# Patient Record
Sex: Female | Born: 1995 | Race: Black or African American | Hispanic: No | Marital: Single | Smoking: Never smoker
Health system: Southern US, Community
[De-identification: ages and names within clinical notes are randomized; demographics above are authoritative.]

## PROBLEM LIST (undated history)

## (undated) ENCOUNTER — Ambulatory Visit: Admission: EM | Discharge status: Absent without leave | Payer: Managed Care, Other (non HMO)

## (undated) DIAGNOSIS — S59909A Unspecified injury of unspecified elbow, initial encounter: Secondary | ICD-10-CM

## (undated) HISTORY — PX: WISDOM TOOTH EXTRACTION: SHX21

---
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
## 2014-04-28 ENCOUNTER — Inpatient Hospital Stay (HOSPITAL_COMMUNITY)
Admission: AD | Admit: 2014-04-28 | Discharge: 2014-04-28 | Disposition: A | Discharge status: Home / Self Care | Payer: Medicaid Other | Source: Ambulatory Visit | Attending: Obstetrics & Gynecology | Admitting: Obstetrics & Gynecology

## 2014-04-28 ENCOUNTER — Encounter (HOSPITAL_COMMUNITY): Payer: Self-pay | Admitting: *Deleted

## 2014-04-28 DIAGNOSIS — N938 Other specified abnormal uterine and vaginal bleeding: Secondary | ICD-10-CM | POA: Insufficient documentation

## 2014-04-28 DIAGNOSIS — N920 Excessive and frequent menstruation with regular cycle: Secondary | ICD-10-CM

## 2014-04-28 DIAGNOSIS — N925 Other specified irregular menstruation: Secondary | ICD-10-CM | POA: Insufficient documentation

## 2014-04-28 DIAGNOSIS — N949 Unspecified condition associated with female genital organs and menstrual cycle: Secondary | ICD-10-CM | POA: Insufficient documentation

## 2014-04-28 LAB — URINALYSIS, ROUTINE W REFLEX MICROSCOPIC
Bilirubin Urine: NEGATIVE
Glucose, UA: NEGATIVE mg/dL
Hgb urine dipstick: NEGATIVE
Ketones, ur: NEGATIVE mg/dL
Leukocytes, UA: NEGATIVE
Nitrite: NEGATIVE
Protein, ur: NEGATIVE mg/dL
Specific Gravity, Urine: 1.025 (ref 1.005–1.030)
Urobilinogen, UA: 0.2 mg/dL (ref 0.0–1.0)
pH: 6 (ref 5.0–8.0)

## 2014-04-28 LAB — CBC
HCT: 37.4 % (ref 36.0–46.0)
Hemoglobin: 12.1 g/dL (ref 12.0–15.0)
MCH: 22.1 pg — ABNORMAL LOW (ref 26.0–34.0)
MCHC: 32.4 g/dL (ref 30.0–36.0)
MCV: 68.2 fL — ABNORMAL LOW (ref 78.0–100.0)
Platelets: 363 10*3/uL (ref 150–400)
RBC: 5.48 MIL/uL — ABNORMAL HIGH (ref 3.87–5.11)
RDW: 16.2 % — ABNORMAL HIGH (ref 11.5–15.5)
WBC: 8.4 10*3/uL (ref 4.0–10.5)

## 2014-04-28 LAB — WET PREP, GENITAL
Trich, Wet Prep: NONE SEEN
Yeast Wet Prep HPF POC: NONE SEEN

## 2014-04-28 LAB — POCT PREGNANCY, URINE: Preg Test, Ur: NEGATIVE

## 2014-04-28 NOTE — MAU Note (Signed)
Pt mother brough her in. Has been on her cycle for 3 weeks and starting to feel weak and light headed

## 2014-04-28 NOTE — Discharge Instructions (Signed)
Contraception Choices Contraception (birth control) is the use of any methods or devices to prevent pregnancy. Below are some methods to help avoid pregnancy. HORMONAL METHODS   Contraceptive implant This is a thin, plastic tube containing progesterone hormone. It does not contain estrogen hormone. Your health care provider inserts the tube in the inner part of the upper arm. The tube can remain in place for up to 3 years. After 3 years, the implant must be removed. The implant prevents the ovaries from releasing an egg (ovulation), thickens the cervical mucus to prevent sperm from entering the uterus, and thins the lining of the inside of the uterus.  Progesterone-only injections These injections are given every 3 months by your health care provider to prevent pregnancy. This synthetic progesterone hormone stops the ovaries from releasing eggs. It also thickens cervical mucus and changes the uterine lining. This makes it harder for sperm to survive in the uterus.  Birth control pills These pills contain estrogen and progesterone hormone. They work by preventing the ovaries from releasing eggs (ovulation). They also cause the cervical mucus to thicken, preventing the sperm from entering the uterus. Birth control pills are prescribed by a health care provider.Birth control pills can also be used to treat heavy periods.  Minipill This type of birth control pill contains only the progesterone hormone. They are taken every day of each month and must be prescribed by your health care provider.  Birth control patch The patch contains hormones similar to those in birth control pills. It must be changed once a week and is prescribed by a health care provider.  Vaginal ring The ring contains hormones similar to those in birth control pills. It is left in the vagina for 3 weeks, removed for 1 week, and then a new one is put back in place. The patient must be comfortable inserting and removing the ring from the  vagina.A health care provider's prescription is necessary.  Emergency contraception Emergency contraceptives prevent pregnancy after unprotected sexual intercourse. This pill can be taken right after sex or up to 5 days after unprotected sex. It is most effective the sooner you take the pills after having sexual intercourse. Most emergency contraceptive pills are available without a prescription. Check with your pharmacist. Do not use emergency contraception as your only form of birth control. BARRIER METHODS   Female condom This is a thin sheath (latex or rubber) that is worn over the penis during sexual intercourse. It can be used with spermicide to increase effectiveness.  Female condom. This is a soft, loose-fitting sheath that is put into the vagina before sexual intercourse.  Diaphragm This is a soft, latex, dome-shaped barrier that must be fitted by a health care provider. It is inserted into the vagina, along with a spermicidal jelly. It is inserted before intercourse. The diaphragm should be left in the vagina for 6 to 8 hours after intercourse.  Cervical cap This is a round, soft, latex or plastic cup that fits over the cervix and must be fitted by a health care provider. The cap can be left in place for up to 48 hours after intercourse.  Sponge This is a soft, circular piece of polyurethane foam. The sponge has spermicide in it. It is inserted into the vagina after wetting it and before sexual intercourse.  Spermicides These are chemicals that kill or block sperm from entering the cervix and uterus. They come in the form of creams, jellies, suppositories, foam, or tablets. They do not require a   prescription. They are inserted into the vagina with an applicator before having sexual intercourse. The process must be repeated every time you have sexual intercourse. INTRAUTERINE CONTRACEPTION  Intrauterine device (IUD) This is a T-shaped device that is put in a woman's uterus during a  menstrual period to prevent pregnancy. There are 2 types:  Copper IUD This type of IUD is wrapped in copper wire and is placed inside the uterus. Copper makes the uterus and fallopian tubes produce a fluid that kills sperm. It can stay in place for 10 years.  Hormone IUD This type of IUD contains the hormone progestin (synthetic progesterone). The hormone thickens the cervical mucus and prevents sperm from entering the uterus, and it also thins the uterine lining to prevent implantation of a fertilized egg. The hormone can weaken or kill the sperm that get into the uterus. It can stay in place for 3 5 years, depending on which type of IUD is used. PERMANENT METHODS OF CONTRACEPTION  Female tubal ligation This is when the woman's fallopian tubes are surgically sealed, tied, or blocked to prevent the egg from traveling to the uterus.  Hysteroscopic sterilization This involves placing a small coil or insert into each fallopian tube. Your doctor uses a technique called hysteroscopy to do the procedure. The device causes scar tissue to form. This results in permanent blockage of the fallopian tubes, so the sperm cannot fertilize the egg. It takes about 3 months after the procedure for the tubes to become blocked. You must use another form of birth control for these 3 months.  Female sterilization This is when the female has the tubes that carry sperm tied off (vasectomy).This blocks sperm from entering the vagina during sexual intercourse. After the procedure, the man can still ejaculate fluid (semen). NATURAL PLANNING METHODS  Natural family planning This is not having sexual intercourse or using a barrier method (condom, diaphragm, cervical cap) on days the woman could become pregnant.  Calendar method This is keeping track of the length of each menstrual cycle and identifying when you are fertile.  Ovulation method This is avoiding sexual intercourse during ovulation.  Symptothermal method This is  avoiding sexual intercourse during ovulation, using a thermometer and ovulation symptoms.  Post ovulation method This is timing sexual intercourse after you have ovulated. Regardless of which type or method of contraception you choose, it is important that you use condoms to protect against the transmission of sexually transmitted infections (STIs). Talk with your health care provider about which form of contraception is most appropriate for you. Document Released: 11/20/2005 Document Revised: 07/23/2013 Document Reviewed: 05/15/2013 Exit[employer] Patient Information 2014 [employer].  

## 2014-04-28 NOTE — MAU Provider Note (Signed)
History     CSN: [id number]  Arrival date and time: 04/28/14 1548   First Provider Initiated Contact with Patient 04/28/14 1948      Chief Complaint  Patient presents with  . Vaginal Bleeding   HPI  Ms. Gina Salas is a 18 y.o. female who presents today to MAU with concerns regarding a heavy menstrual cycle. She started her cycle on May 7th; cycle started light and progressed to a heavy cycle. She noticed approximately 1 week following the start of her cycle that the bleeding changed to brown. A couple of days ago she started bleeding again and it is now heavy.   She is scheduled to see a family practice Dr. On June 16th.   OB History   Grav Para Term Preterm Abortions TAB SAB Ect Mult Living                  History reviewed. No pertinent past medical history.  History reviewed. No pertinent past surgical history.  History reviewed. No pertinent family history.  History  Substance Use Topics  . Smoking status: Never Smoker   . Smokeless tobacco: Never Used  . Alcohol Use: No    Allergies: No Known Allergies  Prescriptions prior to admission  Medication Sig Dispense Refill  . cetirizine (ZYRTEC) 10 MG tablet Take 10 mg by mouth daily as needed for allergies.      Marland Kitchen. ibuprofen (ADVIL,MOTRIN) 200 MG tablet Take 400 mg by mouth every 6 (six) hours as needed for cramping.       Results for orders placed during the hospital encounter of 04/28/14 (from the past 48 hour(s))  URINALYSIS, ROUTINE W REFLEX MICROSCOPIC     Status: None   Collection Time    04/28/14  4:50 PM      Result Value Ref Range   Color, Urine YELLOW  YELLOW   APPearance CLEAR  CLEAR   Specific Gravity, Urine 1.025  1.005 - 1.030   pH 6.0  5.0 - 8.0   Glucose, UA NEGATIVE  NEGATIVE mg/dL   Hgb urine dipstick NEGATIVE  NEGATIVE   Bilirubin Urine NEGATIVE  NEGATIVE   Ketones, ur NEGATIVE  NEGATIVE mg/dL   Protein, ur NEGATIVE  NEGATIVE mg/dL   Urobilinogen, UA 0.2  0.0 - 1.0 mg/dL   Nitrite  NEGATIVE  NEGATIVE   Leukocytes, UA NEGATIVE  NEGATIVE   Comment: MICROSCOPIC NOT DONE ON URINES WITH NEGATIVE PROTEIN, BLOOD, LEUKOCYTES, NITRITE, OR GLUCOSE <1000 mg/dL.  POCT PREGNANCY, URINE     Status: None   Collection Time    04/28/14  5:04 PM      Result Value Ref Range   Preg Test, Ur NEGATIVE  NEGATIVE   Comment:            THE SENSITIVITY OF THIS     METHODOLOGY IS >24 mIU/mL  CBC     Status: Abnormal   Collection Time    04/28/14  6:55 PM      Result Value Ref Range   WBC 8.4  4.0 - 10.5 K/uL   RBC 5.48 (*) 3.87 - 5.11 MIL/uL   Hemoglobin 12.1  12.0 - 15.0 g/dL   HCT 78.237.4  95.636.0 - 21.346.0 %   MCV 68.2 (*) 78.0 - 100.0 fL   MCH 22.1 (*) 26.0 - 34.0 pg   MCHC 32.4  30.0 - 36.0 g/dL   RDW 08.616.2 (*) 57.811.5 - 46.915.5 %   Platelets 363  150 - 400 K/uL  Review of Systems  Constitutional: Negative for fever and chills.  Gastrointestinal: Negative for nausea, vomiting, abdominal pain, diarrhea and constipation.  Genitourinary:       No vaginal discharge. + vaginal bleeding. No dysuria.    Physical Exam   Blood pressure 115/73, pulse 62, temperature 98.5 F (36.9 C), temperature source Oral, resp. rate 16, height 5\' 8"  (1.727 m), weight 60.147 kg (132 lb 9.6 oz), last menstrual period 04/09/2014, SpO2 100.00%.  Physical Exam  Constitutional: She is oriented to person, place, and time. She appears well-developed and well-nourished. No distress.  HENT:  Head: Normocephalic.  Eyes: Pupils are equal, round, and reactive to light.  Neck: Neck supple.  Respiratory: Effort normal.  GI: Soft. She exhibits no distension. There is no tenderness. There is no rebound and no guarding.  Genitourinary:  Speculum exam: Vagina - Small amount of dark red blood in the vault. Mild odor  Cervix - small, mucus bloody discharge  Bimanual exam: Cervix closed, no CMT  Uterus non tender, normal size Adnexa non tender, no masses bilaterally GC/Chlam, wet prep done Chaperone present   Musculoskeletal: Normal range of motion.  Neurological: She is alert and oriented to person, place, and time.  Skin: Skin is warm. She is not diaphoretic.  Psychiatric: Her behavior is normal.   Results for orders placed during the hospital encounter of 04/28/14 (from the past 24 hour(s))  URINALYSIS, ROUTINE W REFLEX MICROSCOPIC     Status: None   Collection Time    04/28/14  4:50 PM      Result Value Ref Range   Color, Urine YELLOW  YELLOW   APPearance CLEAR  CLEAR   Specific Gravity, Urine 1.025  1.005 - 1.030   pH 6.0  5.0 - 8.0   Glucose, UA NEGATIVE  NEGATIVE mg/dL   Hgb urine dipstick NEGATIVE  NEGATIVE   Bilirubin Urine NEGATIVE  NEGATIVE   Ketones, ur NEGATIVE  NEGATIVE mg/dL   Protein, ur NEGATIVE  NEGATIVE mg/dL   Urobilinogen, UA 0.2  0.0 - 1.0 mg/dL   Nitrite NEGATIVE  NEGATIVE   Leukocytes, UA NEGATIVE  NEGATIVE  POCT PREGNANCY, URINE     Status: None   Collection Time    04/28/14  5:04 PM      Result Value Ref Range   Preg Test, Ur NEGATIVE  NEGATIVE  CBC     Status: Abnormal   Collection Time    04/28/14  6:55 PM      Result Value Ref Range   WBC 8.4  4.0 - 10.5 K/uL   RBC 5.48 (*) 3.87 - 5.11 MIL/uL   Hemoglobin 12.1  12.0 - 15.0 g/dL   HCT 52.8  41.3 - 24.4 %   MCV 68.2 (*) 78.0 - 100.0 fL   MCH 22.1 (*) 26.0 - 34.0 pg   MCHC 32.4  30.0 - 36.0 g/dL   RDW 01.0 (*) 27.2 - 53.6 %   Platelets 363  150 - 400 K/uL  WET PREP, GENITAL     Status: Abnormal   Collection Time    04/28/14  8:20 PM      Result Value Ref Range   Yeast Wet Prep HPF POC NONE SEEN  NONE SEEN   Trich, Wet Prep NONE SEEN  NONE SEEN   Clue Cells Wet Prep HPF POC FEW (*) NONE SEEN   WBC, Wet Prep HPF POC FEW (*) NONE SEEN    MAU Course  Procedures None  MDM Pt requested parents  to step out of the room to discuss personal/ sexual questions. Pt is sexually active and would like testing for STI's; this is her first pelvic exam. Pt would like to wait to discuss birth control options  with her new Dr. On June 16  Report given to oncoming CNM who resumes care of the patient  Assessment and Plan   A: Abnormal vaginal bleeding Stable hgb. Patient has an appointment with FM on 05/19/14 Return to MAU as needed   Gina Salas 04/28/2014, 7:48 PM

## 2014-04-28 NOTE — MAU Note (Signed)
Patient states she started her periods at about 13-14. Has been monthly but not always regular. Started her last period on 5-7 and was light, has had bleeding every day and is getting heavier. Has a tampon in since 1400.

## 2014-04-29 ENCOUNTER — Telehealth: Payer: Self-pay

## 2014-04-29 LAB — GC/CHLAMYDIA PROBE AMP
CT Probe RNA: POSITIVE — AB
GC Probe RNA: NEGATIVE

## 2014-04-29 MED ORDER — AZITHROMYCIN 500 MG PO TABS
1000.0000 mg | ORAL_TABLET | Freq: Once | ORAL | Status: DC
Start: 2014-04-29 — End: 2014-05-20

## 2014-04-29 NOTE — Telephone Encounter (Signed)
Mdication e-prescribed. Pt. Called and informed. No questions or concerns.

## 2014-04-29 NOTE — Telephone Encounter (Signed)
Message copied by Louanna Raw on Wed Apr 29, 2014  4:30 PM ------      Message from: Pennie Banter      Created: Wed Apr 29, 2014  2:23 PM       Patient returned call, notified her of positive chlamydia culture.  Patient has not been treated and will need Rx called in per protocol to [facility].  Instructed patient to notify her partner for treatment. ------

## 2014-05-20 ENCOUNTER — Emergency Department (INDEPENDENT_AMBULATORY_CARE_PROVIDER_SITE_OTHER)
Admission: EM | Admit: 2014-05-20 | Discharge: 2014-05-20 | Disposition: A | Discharge status: Home / Self Care | Payer: Medicaid Other | Source: Home / Self Care

## 2014-05-20 ENCOUNTER — Encounter (HOSPITAL_COMMUNITY): Payer: Self-pay | Admitting: Emergency Medicine

## 2014-05-20 DIAGNOSIS — J029 Acute pharyngitis, unspecified: Secondary | ICD-10-CM

## 2014-05-20 LAB — POCT RAPID STREP A: STREPTOCOCCUS, GROUP A SCREEN (DIRECT): NEGATIVE

## 2014-05-20 MED ORDER — AMOXICILLIN 500 MG PO CAPS: 1000.0000 mg | ORAL_CAPSULE | Freq: Two times a day (BID) | ORAL | Status: DC

## 2014-05-20 NOTE — ED Provider Notes (Signed)
Medical screening examination/treatment/procedure(s) were performed by a resident physician or non-physician practitioner and as the supervising physician I was immediately available for consultation/collaboration.  Evan Corey, MD    Evan S Corey, MD 05/20/14 2013 

## 2014-05-20 NOTE — Discharge Instructions (Signed)
Pharyngitis °Pharyngitis is redness, pain, and swelling (inflammation) of your pharynx.  °CAUSES  °Pharyngitis is usually caused by infection. Most of the time, these infections are from viruses (viral) and are part of a cold. However, sometimes pharyngitis is caused by bacteria (bacterial). Pharyngitis can also be caused by allergies. Viral pharyngitis may be spread from person to person by coughing, sneezing, and personal items or utensils (cups, forks, spoons, toothbrushes). Bacterial pharyngitis may be spread from person to person by more intimate contact, such as kissing.  °SIGNS AND SYMPTOMS  °Symptoms of pharyngitis include:   °· Sore throat.   °· Tiredness (fatigue).   °· Low-grade fever.   °· Headache. °· Joint pain and muscle aches. °· Skin rashes. °· Swollen lymph nodes. °· Plaque-like film on throat or tonsils (often seen with bacterial pharyngitis). °DIAGNOSIS  °Your health care provider will ask you questions about your illness and your symptoms. Your medical history, along with a physical exam, is often all that is needed to diagnose pharyngitis. Sometimes, a rapid strep test is done. Other lab tests may also be done, depending on the suspected cause.  °TREATMENT  °Viral pharyngitis will usually get better in 3-4 days without the use of medicine. Bacterial pharyngitis is treated with medicines that kill germs (antibiotics).  °HOME CARE INSTRUCTIONS  °· Drink enough water and fluids to keep your urine clear or pale yellow.   °· Only take over-the-counter or prescription medicines as directed by your health care provider:   °· If you are prescribed antibiotics, make sure you finish them even if you start to feel better.   °· Do not take aspirin.   °· Get lots of rest.   °· Gargle with 8 oz of salt water (½ tsp of salt per 1 qt of water) as often as every 1-2 hours to soothe your throat.   °· Throat lozenges (if you are not at risk for choking) or sprays may be used to soothe your throat. °SEEK MEDICAL  CARE IF:  °· You have large, tender lumps in your neck. °· You have a rash. °· You cough up green, yellow-brown, or bloody spit. °SEEK IMMEDIATE MEDICAL CARE IF:  °· Your neck becomes stiff. °· You drool or are unable to swallow liquids. °· You vomit or are unable to keep medicines or liquids down. °· You have severe pain that does not go away with the use of recommended medicines. °· You have trouble breathing (not caused by a stuffy nose). °MAKE SURE YOU:  °· Understand these instructions. °· Will watch your condition. °· Will get help right away if you are not doing well or get worse. °Document Released: 11/20/2005 Document Revised: 09/10/2013 Document Reviewed: 07/28/2013 °ExitCare® Patient Information ©2015 [employer]. This information is not intended to replace advice given to you by your health care provider. Make sure you discuss any questions you have with your health care provider. ° °Sore Throat °A sore throat is pain, burning, irritation, or scratchiness of the throat. There is often pain or tenderness when swallowing or talking. A sore throat may be accompanied by other symptoms, such as coughing, sneezing, fever, and swollen neck glands. A sore throat is often the first sign of another sickness, such as a cold, flu, strep throat, or mononucleosis (commonly known as mono). Most sore throats go away without medical treatment. °CAUSES  °The most common causes of a sore throat include: °· A viral infection, such as a cold, flu, or mono. °· A bacterial infection, such as strep throat, tonsillitis, or whooping cough. °·   Seasonal allergies. °· Dryness in the air. °· Irritants, such as smoke or pollution. °· Gastroesophageal reflux disease (GERD). °HOME CARE INSTRUCTIONS  °· Only take over-the-counter medicines as directed by your caregiver. °· Drink enough fluids to keep your urine clear or pale yellow. °· Rest as needed. °· Try using throat sprays, lozenges, or sucking on hard candy to ease any pain (if  older than 4 years or as directed). °· Sip warm liquids, such as broth, herbal tea, or warm water with honey to relieve pain temporarily. You may also eat or drink cold or frozen liquids such as frozen ice pops. °· Gargle with salt water (mix 1 tsp salt with 8 oz of water). °· Do not smoke and avoid secondhand smoke. °· Put a cool-mist humidifier in your bedroom at night to moisten the air. You can also turn on a hot shower and sit in the bathroom with the door closed for 5-10 minutes. °SEEK IMMEDIATE MEDICAL CARE IF: °· You have difficulty breathing. °· You are unable to swallow fluids, soft foods, or your saliva. °· You have increased swelling in the throat. °· Your sore throat does not get better in 7 days. °· You have nausea and vomiting. °· You have a fever or persistent symptoms for more than 2-3 days. °· You have a fever and your symptoms suddenly get worse. °MAKE SURE YOU:  °· Understand these instructions. °· Will watch your condition. °· Will get help right away if you are not doing well or get worse. °Document Released: 12/28/2004 Document Revised: 11/06/2012 Document Reviewed: 07/28/2012 °ExitCare® Patient Information ©2015 [employer]. This information is not intended to replace advice given to you by your health care provider. Make sure you discuss any questions you have with your health care provider. ° °

## 2014-05-20 NOTE — ED Provider Notes (Signed)
CSN: [id number]     Arrival date & time 05/20/14  1454 History   First MD Initiated Contact with Patient 05/20/14 1642     Chief Complaint  Patient presents with  . Sore Throat   (Consider location/radiation/quality/duration/timing/severity/associated sxs/prior Treatment) HPI Comments: 18 year old female with a sore throat for 2 days. Associated with the fever of 99.5 at home yesterday.   History reviewed. No pertinent past medical history. History reviewed. No pertinent past surgical history. History reviewed. No pertinent family history. History  Substance Use Topics  . Smoking status: Never Smoker   . Smokeless tobacco: Never Used  . Alcohol Use: No   OB History   Grav Para Term Preterm Abortions TAB SAB Ect Mult Living                 Review of Systems  Constitutional: Positive for fever and activity change.  HENT: Positive for congestion and postnasal drip.   Eyes: Negative.   Respiratory: Negative.   Cardiovascular: Negative.   Gastrointestinal: Negative.   Genitourinary: Negative.   Neurological: Negative.     Allergies  Review of patient's allergies indicates no known allergies.  Home Medications   Prior to Admission medications   Medication Sig Start Date End Date Taking? Authorizing Provider  amoxicillin (AMOXIL) 500 MG capsule Take 2 capsules (1,000 mg total) by mouth 2 (two) times daily. 05/20/14   Hayden Rasmussenavid Mabe, NP  cetirizine (ZYRTEC) 10 MG tablet Take 10 mg by mouth daily as needed for allergies.    Historical Provider, MD  ibuprofen (ADVIL,MOTRIN) 200 MG tablet Take 400 mg by mouth every 6 (six) hours as needed for cramping.    Historical Provider, MD   BP 113/82  Pulse 89  Temp(Src) 98.7 F (37.1 C) (Oral)  Resp 12  SpO2 98%  LMP 04/09/2014 Physical Exam  Nursing note and vitals reviewed. Constitutional: She is oriented to person, place, and time. She appears well-developed and well-nourished. No distress.  HENT:  Bilateral TMs are  normal Oropharynx with cobblestoning and PND the posterior pharynx. Bilateral palatine tonsils are enlarged, beefy erythema with exudates.  Eyes: Conjunctivae and EOM are normal.  Neck: Normal range of motion. Neck supple.  Cardiovascular: Normal rate, regular rhythm and normal heart sounds.   Pulmonary/Chest: Effort normal and breath sounds normal. No respiratory distress. She has no wheezes. She has no rales.  Abdominal: Soft. There is no tenderness.  Lymphadenopathy:    She has cervical adenopathy.  Neurological: She is alert and oriented to person, place, and time.  Skin: Skin is warm and dry. No rash noted.  Psychiatric: She has a normal mood and affect.    ED Course  Procedures (including critical care time) Labs Review Labs Reviewed  POCT RAPID STREP A ([facility] CARE ONLY)   Results for orders placed during the hospital encounter of 05/20/14  POCT RAPID STREP A ([facility] CARE ONLY)      Result Value Ref Range   Streptococcus, Group A Screen (Direct) NEGATIVE  NEGATIVE    Imaging Review No results found.   MDM   1. Exudative pharyngitis     Amoxicillin Cepacol loz ibuprofen    Hayden Rasmussenavid Mabe, NP 05/20/14 1655

## 2014-05-20 NOTE — ED Notes (Signed)
Concern for ST since  > Monday . Tonsils red swollen, exudative, c/o unable to eat or drink today due to pain

## 2014-05-22 LAB — CULTURE, GROUP A STREP

## 2014-05-23 ENCOUNTER — Encounter (HOSPITAL_COMMUNITY): Payer: Self-pay | Admitting: Emergency Medicine

## 2014-05-23 ENCOUNTER — Emergency Department (HOSPITAL_COMMUNITY): Payer: Medicaid Other

## 2014-05-23 ENCOUNTER — Emergency Department (HOSPITAL_COMMUNITY)
Admission: EM | Admit: 2014-05-23 | Discharge: 2014-05-23 | Disposition: A | Discharge status: Home / Self Care | Payer: Medicaid Other | Attending: Emergency Medicine | Admitting: Emergency Medicine

## 2014-05-23 DIAGNOSIS — S52132A Displaced fracture of neck of left radius, initial encounter for closed fracture: Secondary | ICD-10-CM

## 2014-05-23 DIAGNOSIS — Z792 Long term (current) use of antibiotics: Secondary | ICD-10-CM | POA: Insufficient documentation

## 2014-05-23 DIAGNOSIS — Y9364 Activity, baseball: Secondary | ICD-10-CM | POA: Insufficient documentation

## 2014-05-23 DIAGNOSIS — Y9239 Other specified sports and athletic area as the place of occurrence of the external cause: Secondary | ICD-10-CM | POA: Insufficient documentation

## 2014-05-23 DIAGNOSIS — W219XXA Striking against or struck by unspecified sports equipment, initial encounter: Secondary | ICD-10-CM | POA: Insufficient documentation

## 2014-05-23 DIAGNOSIS — S129XXA Fracture of neck, unspecified, initial encounter: Secondary | ICD-10-CM | POA: Insufficient documentation

## 2014-05-23 DIAGNOSIS — Y92838 Other recreation area as the place of occurrence of the external cause: Secondary | ICD-10-CM

## 2014-05-23 MED ORDER — IBUPROFEN 400 MG PO TABS
400.0000 mg | ORAL_TABLET | Freq: Once | ORAL | Status: AC
  Administered 2014-05-23: 400 mg via ORAL
  Filled 2014-05-23: qty 1

## 2014-05-23 NOTE — Discharge Instructions (Signed)
Elbow Fracture, Simple A fracture is a break in one of the bones.When fractures are not displaced or separated, they may be treated with only a sling or splint. The sling or splint may only be required for two to three weeks. In these cases, often the elbow is put through early range of motion exercises to prevent the elbow from getting stiff. DIAGNOSIS  The diagnosis (learning what is wrong) of a fractured elbow is made by x-ray. These may be required before and after the elbow is put into a splint or cast. X-rays are taken after to make sure the bone pieces have not moved. HOME CARE INSTRUCTIONS   Only take over-the-counter or prescription medicines for pain, discomfort, or fever as directed by your caregiver.  If you have a splint held on with an elastic wrap and your hand or fingers become numb or cold and blue, loosen the wrap and reapply more loosely. See your caregiver if there is no relief.  You may use ice for twenty minutes, four times per day, for the first two to three days.  Use your elbow as directed.  See your caregiver as directed. It is very important to keep all follow-up referrals and appointments in order to avoid any long-term problems with your elbow including chronic pain or stiffness. SEEK IMMEDIATE MEDICAL CARE IF:   There is swelling or increasing pain in elbow.  You begin to lose feeling or experience numbness or tingling in your hand or fingers.  You develop swelling of the hand and fingers.  You get a cold or blue hand or fingers on affected side. MAKE SURE YOU:   Understand these instructions.  Will watch your condition.  Will get help right away if you are not doing well or get worse. Document Released: 11/14/2001 Document Revised: 02/12/2012 Document Reviewed: 10/05/2009 [employer] Patient Information 2015 [employer]. This information is not intended to replace advice given to you by your health care provider. Make sure you discuss any questions you  have with your health care provider.  

## 2014-05-23 NOTE — ED Provider Notes (Signed)
CSN: [id number]     Arrival date & time 05/23/14  1326 History  This chart was scribed for non-physician practitioner, Junious SilkHannah Merrell, PA-C,working with Layla MawKristen N Ward, DO, by Karle PlumberJennifer Tensley, ED Scribe.  This patient was seen in room TR08C/TR08C and the patient's care was started at 1:39 PM.  Chief Complaint  Patient presents with  . Arm Injury   The history is provided by the patient. No language interpreter was used.   HPI Comments:  Gina BlewJulia L Salas is a 18 y.o. female brought in by EMS, who presents to the Emergency Department complaining of severe left elbow pain secondary to running into a fence and flipping over it, landing on her left elbow while playing softball PTA. Pt states the pain radiates down to her wrist. She denies shoulder pain. Pt denies numbness, tingling, SOB, CP, head injury, LOC, visual disturbance, nausea, vomiting, or abdominal pain. No headache, chest pain, shortness of breath, nausea, vomiting, abdominal pain. Pt states she is right hand dominant.  History reviewed. No pertinent past medical history. History reviewed. No pertinent past surgical history. No family history on file. History  Substance Use Topics  . Smoking status: Never Smoker   . Smokeless tobacco: Never Used  . Alcohol Use: No   OB History   Grav Para Term Preterm Abortions TAB SAB Ect Mult Living                 Review of Systems  Eyes: Negative for visual disturbance.  Respiratory: Negative for shortness of breath.   Cardiovascular: Negative for chest pain.  Gastrointestinal: Negative for nausea, vomiting and abdominal pain.  Musculoskeletal: Positive for arthralgias (left elbow).  Neurological: Negative for syncope.  All other systems reviewed and are negative.   Allergies  Review of patient's allergies indicates no known allergies.  Home Medications   Prior to Admission medications   Medication Sig Start Date End Date Taking? Authorizing Provider  amoxicillin (AMOXIL) 500 MG  capsule Take 2 capsules (1,000 mg total) by mouth 2 (two) times daily. 05/20/14   Hayden Rasmussenavid Mabe, NP  cetirizine (ZYRTEC) 10 MG tablet Take 10 mg by mouth daily as needed for allergies.    Historical Provider, MD  ibuprofen (ADVIL,MOTRIN) 200 MG tablet Take 400 mg by mouth every 6 (six) hours as needed for cramping.    Historical Provider, MD   Triage Vitals: BP 121/78  Pulse 75  Temp(Src) 98.4 F (36.9 C) (Oral)  Resp 16  SpO2 100%  LMP 04/09/2014 Physical Exam  Nursing note and vitals reviewed. Constitutional: She is oriented to person, place, and time. She appears well-developed and well-nourished. No distress.  HENT:  Head: Normocephalic and atraumatic.  Right Ear: External ear normal.  Left Ear: External ear normal.  Nose: Nose normal.  Mouth/Throat: Oropharynx is clear and moist.  Eyes: Conjunctivae and EOM are normal. Pupils are equal, round, and reactive to light.  Neck: Normal range of motion.  Cardiovascular: Normal rate, regular rhythm and normal heart sounds.   2+ radial pulse.  Pulmonary/Chest: Effort normal and breath sounds normal. No stridor. No respiratory distress. She has no wheezes. She has no rales.  Abdominal: Soft. She exhibits no distension.  Musculoskeletal: Normal range of motion. She exhibits tenderness. She exhibits no edema.  Tenderness to palpation diffusely over left elbow. No joint effusion. ROM limited due to pain. No tenderness to palpation over shoulder or wrist. Compartments soft.  Neurological: She is alert and oriented to person, place, and time. She has normal  strength.  Grip strength 5/5. Neurovascularly intact. Sensation intact.  Skin: Skin is warm and dry. She is not diaphoretic. No erythema.  Psychiatric: She has a normal mood and affect. Her behavior is normal.    ED Course  Procedures (including critical care time) DIAGNOSTIC STUDIES: Oxygen Saturation is 100% on RA, normal by my interpretation.   COORDINATION OF CARE: 1:44 PM- Will  order Ibuprofen and X-Ray. Pt verbalizes understanding and agrees to plan.  Medications  ibuprofen (ADVIL,MOTRIN) tablet 400 mg (400 mg Oral Given 05/23/14 1352)    Labs Review Labs Reviewed - No data to display  Imaging Review Dg Elbow Complete Left  05/23/2014   CLINICAL DATA:  18 year old female with left elbow injury and pain.  EXAM: LEFT ELBOW - COMPLETE 3+ VIEW  COMPARISON:  None.  FINDINGS: Mild cortical irregularity along the lateral radial neck is noted suspicious for a nondisplaced buckle fracture. A small joint effusion is present.  No other fracture, subluxation or dislocation.  No other bony abnormalities are noted.  IMPRESSION: Possible buckle fracture of the radial neck.  Small joint effusion.   Electronically Signed   By: Laveda AbbeJeff  Hu M.D.   On: 05/23/2014 14:53     EKG Interpretation None      MDM   Final diagnoses:  Radial neck fracture, left, closed, initial encounter    Patient presents to ED after injury playing softball. She fell, flipping over a fence and landed on her elbow. Pain in elbow. No other injuries, LOC. Non focal neuro exam. Patient found to have possible buckle fracture of the radial neck. She was placed in a sling and encouraged to follow up with orthopedics on Monday. She is neurovascularly intact and compartment is soft. Discussed case with Dr. Elesa MassedWard who agrees with plan. Return instructions given. Vital signs stable for discharge. Patient / Family / Caregiver informed of clinical course, understand medical decision-making process, and agree with plan.   I personally performed the services described in this documentation, which was scribed in my presence. The recorded information has been reviewed and is accurate.    Mora BellmanHannah S Merrell, PA-C 05/23/14 1643

## 2014-05-23 NOTE — ED Notes (Signed)
Per EMS: Pt attempting to catch ball, fell over a fence landing on left arm. Pt now has pain to left elbow with radiation to left wrist. Pulses intact, no obvious injury, equal strength. NAD.

## 2014-05-23 NOTE — ED Notes (Signed)
Arm sling applied

## 2014-05-24 NOTE — ED Provider Notes (Signed)
Medical screening examination/treatment/procedure(s) were performed by non-physician practitioner and as supervising physician I was immediately available for consultation/collaboration.   EKG Interpretation None        Layla MawKristen N Ninel Abdella, DO 05/24/14 0700

## 2015-06-02 ENCOUNTER — Emergency Department (HOSPITAL_COMMUNITY): Payer: Managed Care, Other (non HMO)

## 2015-06-02 ENCOUNTER — Emergency Department (HOSPITAL_COMMUNITY)
Admission: EM | Admit: 2015-06-02 | Discharge: 2015-06-02 | Disposition: A | Discharge status: Home / Self Care | Payer: Managed Care, Other (non HMO) | Attending: Emergency Medicine | Admitting: Emergency Medicine

## 2015-06-02 ENCOUNTER — Encounter (HOSPITAL_COMMUNITY): Payer: Self-pay | Admitting: *Deleted

## 2015-06-02 DIAGNOSIS — S59909A Unspecified injury of unspecified elbow, initial encounter: Secondary | ICD-10-CM | POA: Insufficient documentation

## 2015-06-02 DIAGNOSIS — Y9289 Other specified places as the place of occurrence of the external cause: Secondary | ICD-10-CM | POA: Insufficient documentation

## 2015-06-02 DIAGNOSIS — Y998 Other external cause status: Secondary | ICD-10-CM | POA: Insufficient documentation

## 2015-06-02 DIAGNOSIS — Z792 Long term (current) use of antibiotics: Secondary | ICD-10-CM | POA: Insufficient documentation

## 2015-06-02 DIAGNOSIS — X58XXXA Exposure to other specified factors, initial encounter: Secondary | ICD-10-CM | POA: Insufficient documentation

## 2015-06-02 DIAGNOSIS — Y9364 Activity, baseball: Secondary | ICD-10-CM | POA: Insufficient documentation

## 2015-06-02 DIAGNOSIS — Z87828 Personal history of other (healed) physical injury and trauma: Secondary | ICD-10-CM | POA: Insufficient documentation

## 2015-06-02 DIAGNOSIS — S8992XA Unspecified injury of left lower leg, initial encounter: Secondary | ICD-10-CM | POA: Insufficient documentation

## 2015-06-02 HISTORY — DX: Unspecified injury of unspecified elbow, initial encounter: S59.909A

## 2015-06-02 MED ORDER — NAPROXEN 500 MG PO TABS: 500.0000 mg | ORAL_TABLET | Freq: Two times a day (BID) | ORAL | Status: AC

## 2015-06-02 NOTE — ED Notes (Signed)
Declined W/C at D/C and was escorted to lobby by RN. 

## 2015-06-02 NOTE — ED Notes (Signed)
Pt reports while playing Soft ball on Tuesday she felt er Lt knee pop and now is unable to bend the knee due to pain.

## 2015-06-02 NOTE — Progress Notes (Signed)
Orthopedic Tech Progress Note Patient Details:  Sharlee BlewJulia L Roppolo 1996/06/17 [id number]  Ortho Devices Type of Ortho Device: Crutches Ortho Device/Splint Interventions: Application Nursing staff provided knee immobilizer; ortho provided crutches  Nikki DomCrawford, Harpreet Signore 06/02/2015, 10:53 AM

## 2015-06-02 NOTE — Discharge Instructions (Signed)
Keep the knee immobilizer on unless you are at home resting. At that time, ice and elevate your knee. Take naproxen as prescribed. Follow-up with orthopedics. Knee Pain The knee is the complex joint between your thigh and your lower leg. It is made up of bones, tendons, ligaments, and cartilage. The bones that make up the knee are:  The femur in the thigh.  The tibia and fibula in the lower leg.  The patella or kneecap riding in the groove on the lower femur. CAUSES  Knee pain is a common complaint with many causes. A few of these causes are:  Injury, such as:  A ruptured ligament or tendon injury.  Torn cartilage.  Medical conditions, such as:  Gout  Arthritis  Infections  Overuse, over training, or overdoing a physical activity. Knee pain can be minor or severe. Knee pain can accompany debilitating injury. Minor knee problems often respond well to self-care measures or get well on their own. More serious injuries may need medical intervention or even surgery. SYMPTOMS The knee is complex. Symptoms of knee problems can vary widely. Some of the problems are:  Pain with movement and weight bearing.  Swelling and tenderness.  Buckling of the knee.  Inability to straighten or extend your knee.  Your knee locks and you cannot straighten it.  Warmth and redness with pain and fever.  Deformity or dislocation of the kneecap. DIAGNOSIS  Determining what is wrong may be very straight forward such as when there is an injury. It can also be challenging because of the complexity of the knee. Tests to make a diagnosis may include:  Your caregiver taking a history and doing a physical exam.  Routine X-rays can be used to rule out other problems. X-rays will not reveal a cartilage tear. Some injuries of the knee can be diagnosed by:  Arthroscopy a surgical technique by which a small video camera is inserted through tiny incisions on the sides of the knee. This procedure is used to  examine and repair internal knee joint problems. Tiny instruments can be used during arthroscopy to repair the torn knee cartilage (meniscus).  Arthrography is a radiology technique. A contrast liquid is directly injected into the knee joint. Internal structures of the knee joint then become visible on X-ray film.  An MRI scan is a non X-ray radiology procedure in which magnetic fields and a computer produce two- or three-dimensional images of the inside of the knee. Cartilage tears are often visible using an MRI scanner. MRI scans have largely replaced arthrography in diagnosing cartilage tears of the knee.  Blood work.  Examination of the fluid that helps to lubricate the knee joint (synovial fluid). This is done by taking a sample out using a needle and a syringe. TREATMENT The treatment of knee problems depends on the cause. Some of these treatments are:  Depending on the injury, proper casting, splinting, surgery, or physical therapy care will be needed.  Give yourself adequate recovery time. Do not overuse your joints. If you begin to get sore during workout routines, back off. Slow down or do fewer repetitions.  For repetitive activities such as cycling or running, maintain your strength and nutrition.  Alternate muscle groups. For example, if you are a weight lifter, work the upper body on one day and the lower body the next.  Either tight or weak muscles do not give the proper support for your knee. Tight or weak muscles do not absorb the stress placed on the knee  joint. Keep the muscles surrounding the knee strong.  Take care of mechanical problems.  If you have flat feet, orthotics or special shoes may help. See your caregiver if you need help.  Arch supports, sometimes with wedges on the inner or outer aspect of the heel, can help. These can shift pressure away from the side of the knee most bothered by osteoarthritis.  A brace called an "unloader" brace also may be used to  help ease the pressure on the most arthritic side of the knee.  If your caregiver has prescribed crutches, braces, wraps or ice, use as directed. The acronym for this is PRICE. This means protection, rest, ice, compression, and elevation.  Nonsteroidal anti-inflammatory drugs (NSAIDs), can help relieve pain. But if taken immediately after an injury, they may actually increase swelling. Take NSAIDs with food in your stomach. Stop them if you develop stomach problems. Do not take these if you have a history of ulcers, stomach pain, or bleeding from the bowel. Do not take without your caregiver's approval if you have problems with fluid retention, heart failure, or kidney problems.  For ongoing knee problems, physical therapy may be helpful.  Glucosamine and chondroitin are over-the-counter dietary supplements. Both may help relieve the pain of osteoarthritis in the knee. These medicines are different from the usual anti-inflammatory drugs. Glucosamine may decrease the rate of cartilage destruction.  Injections of a corticosteroid drug into your knee joint may help reduce the symptoms of an arthritis flare-up. They may provide pain relief that lasts a few months. You may have to wait a few months between injections. The injections do have a small increased risk of infection, water retention, and elevated blood sugar levels.  Hyaluronic acid injected into damaged joints may ease pain and provide lubrication. These injections may work by reducing inflammation. A series of shots may give relief for as long as 6 months.  Topical painkillers. Applying certain ointments to your skin may help relieve the pain and stiffness of osteoarthritis. Ask your pharmacist for suggestions. Many over the-counter products are approved for temporary relief of arthritis pain.  In some countries, doctors often prescribe topical NSAIDs for relief of chronic conditions such as arthritis and tendinitis. A review of treatment with  NSAID creams found that they worked as well as oral medications but without the serious side effects. PREVENTION  Maintain a healthy weight. Extra pounds put more strain on your joints.  Get strong, stay limber. Weak muscles are a common cause of knee injuries. Stretching is important. Include flexibility exercises in your workouts.  Be smart about exercise. If you have osteoarthritis, chronic knee pain or recurring injuries, you may need to change the way you exercise. This does not mean you have to stop being active. If your knees ache after jogging or playing basketball, consider switching to swimming, water aerobics, or other low-impact activities, at least for a few days a week. Sometimes limiting high-impact activities will provide relief.  Make sure your shoes fit well. Choose footwear that is right for your sport.  Protect your knees. Use the proper gear for knee-sensitive activities. Use kneepads when playing volleyball or laying carpet. Buckle your seat belt every time you drive. Most shattered kneecaps occur in car accidents.  Rest when you are tired. SEEK MEDICAL CARE IF:  You have knee pain that is continual and does not seem to be getting better.  SEEK IMMEDIATE MEDICAL CARE IF:  Your knee joint feels hot to the touch and you have  a high fever. MAKE SURE YOU:   Understand these instructions.  Will watch your condition.  Will get help right away if you are not doing well or get worse. Document Released: 09/17/2007 Document Revised: 02/12/2012 Document Reviewed: 09/17/2007 [facility] Patient Information 2015 [place], [place]. This information is not intended to replace advice given to you by your health care provider. Make sure you discuss any questions you have with your health care provider.

## 2015-06-02 NOTE — ED Provider Notes (Signed)
CSN: [id number]     Arrival date & time 06/02/15  0919 History  This chart was scribed for Celene Skeen, PA-C, working with Cathren Laine, MD by Elon Spanner, ED Scribe. This patient was seen in room TR05C/TR05C and the patient's care was started at 10:08 AM.   Chief Complaint  Patient presents with  . Knee Pain   The history is provided by the patient. No language interpreter was used.   HPI Comments: Gina Salas is a 19 y.o. female who presents to the Emergency Department complaining of left knee pain and swelling onset last night.  The patient reports she was playing softball, when she planted to throw a ball, heard a pop, and her complaint onset.  The pain is rated currently 7/10, worse with walking, and improved from 8/10 last night.  She has taken ibuprofen with some relief.  She denies history of prior injury to the knee.    Past Medical History  Diagnosis Date  . Elbow injury    Past Surgical History  Procedure Laterality Date  . Wisdom tooth extraction Bilateral    History reviewed. No pertinent family history. History  Substance Use Topics  . Smoking status: Never Smoker   . Smokeless tobacco: Never Used  . Alcohol Use: No   OB History    No data available     Review of Systems  Constitutional: Negative for fever.  Musculoskeletal: Positive for joint swelling and arthralgias.  Skin: Negative for color change.      Allergies  Review of patient's allergies indicates no known allergies.  Home Medications   Prior to Admission medications   Medication Sig Start Date End Date Taking? Authorizing Provider  amoxicillin (AMOXIL) 500 MG capsule Take 2 capsules (1,000 mg total) by mouth 2 (two) times daily. 05/20/14   Hayden Rasmussen, NP  cetirizine (ZYRTEC) 10 MG tablet Take 10 mg by mouth daily as needed for allergies.    Historical Provider, MD  ibuprofen (ADVIL,MOTRIN) 200 MG tablet Take 400 mg by mouth every 6 (six) hours as needed for cramping.    Historical Provider, MD   naproxen (NAPROSYN) 500 MG tablet Take 1 tablet (500 mg total) by mouth 2 (two) times daily. 06/02/15   Ayvah Caroll M Xeng Kucher, PA-C   BP 117/68 mmHg  Pulse 78  Temp(Src) 97.8 F (36.6 C) (Oral)  Resp 16  SpO2 100%  LMP 06/01/2015 Physical Exam  Constitutional: She is oriented to person, place, and time. She appears well-developed and well-nourished. No distress.  HENT:  Head: Normocephalic and atraumatic.  Mouth/Throat: Oropharynx is clear and moist.  Eyes: Conjunctivae and EOM are normal.  Neck: Normal range of motion. Neck supple.  Cardiovascular: Normal rate, regular rhythm and normal heart sounds.   Pulses:      Dorsalis pedis pulses are 2+ on the left side.       Posterior tibial pulses are 2+ on the left side.  Pulmonary/Chest: Effort normal and breath sounds normal. No respiratory distress.  Musculoskeletal:  L knee TTP laterally. Positive McMurray's. No anterior drawer sign appreciated. FROM. Mild swelling.  Neurological: She is alert and oriented to person, place, and time. No sensory deficit.  Skin: Skin is warm and dry.  Psychiatric: She has a normal mood and affect. Her behavior is normal.  Nursing note and vitals reviewed.   ED Course  Procedures (including critical care time)  DIAGNOSTIC STUDIES: Oxygen Saturation is 100% on RA, normal by my interpretation.    COORDINATION OF CARE:  10:10 AM Discussed treatment plan with patient at bedside.  Patient acknowledges and agrees with plan.    Labs Review Labs Reviewed - No data to display  Imaging Review Dg Knee Complete 4 Views Left  06/02/2015   CLINICAL DATA:  Left knee pain after a softball injury, superior lateral and inferior left knee pain. Pain with movement and weight-bearing. Initial encounter.  EXAM: LEFT KNEE - COMPLETE 4+ VIEW  COMPARISON:  None.  FINDINGS: No joint effusion or fracture.  No degenerative changes.  IMPRESSION: Negative.   Electronically Signed   By: Leanna BattlesMelinda  Blietz M.D.   On: 06/02/2015 10:02      EKG Interpretation None      MDM   Final diagnoses:  Left knee injury, initial encounter   Neurovascularly intact. X-ray negative. Positive McMurray's. Possible meniscal injury. Knee immobilizer applied. Crutches given. Advised rice and NSAIDs. Follow-up with orthopedics. Stable for discharge. Return precautions given. Patient states understanding of treatment care plan and is agreeable.  I personally performed the services described in this documentation, which was scribed in my presence. The recorded information has been reviewed and is accurate.  Kathrynn SpeedRobyn M Lauralyn Shadowens, PA-C 06/02/15 1027  Cathren LaineKevin Steinl, MD 06/02/15 [phone]

## 2015-08-22 IMAGING — DX DG KNEE COMPLETE 4+V*L*
4 series · 4 of 4 positions shown · non-contrast
Comparison: None.

CLINICAL DATA: Left knee pain after a softball injury, superior
lateral and inferior left knee pain. Pain with movement and
weight-bearing. Initial encounter.

EXAM:
LEFT KNEE - COMPLETE 4+ VIEW

[knee ap]
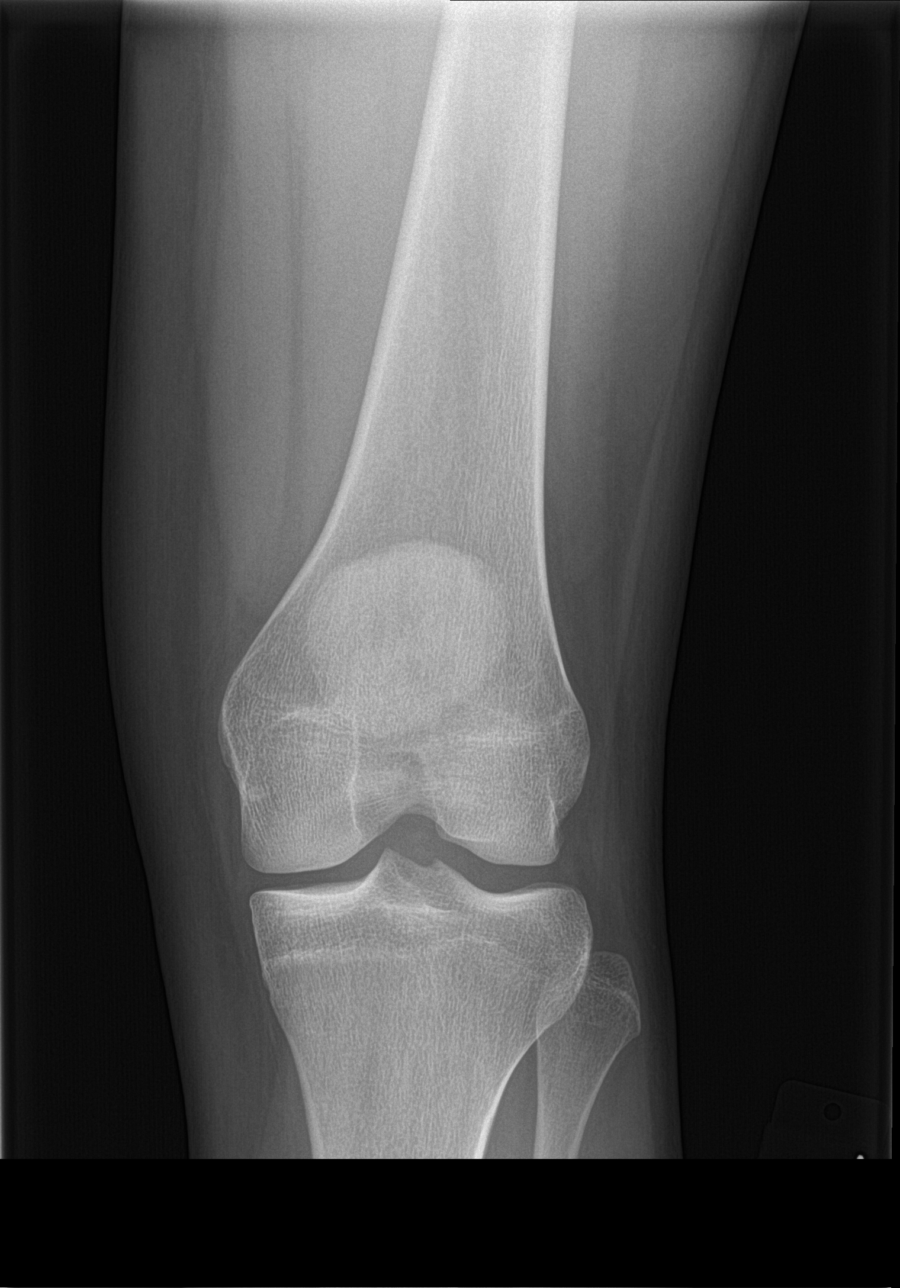

[knee lat]
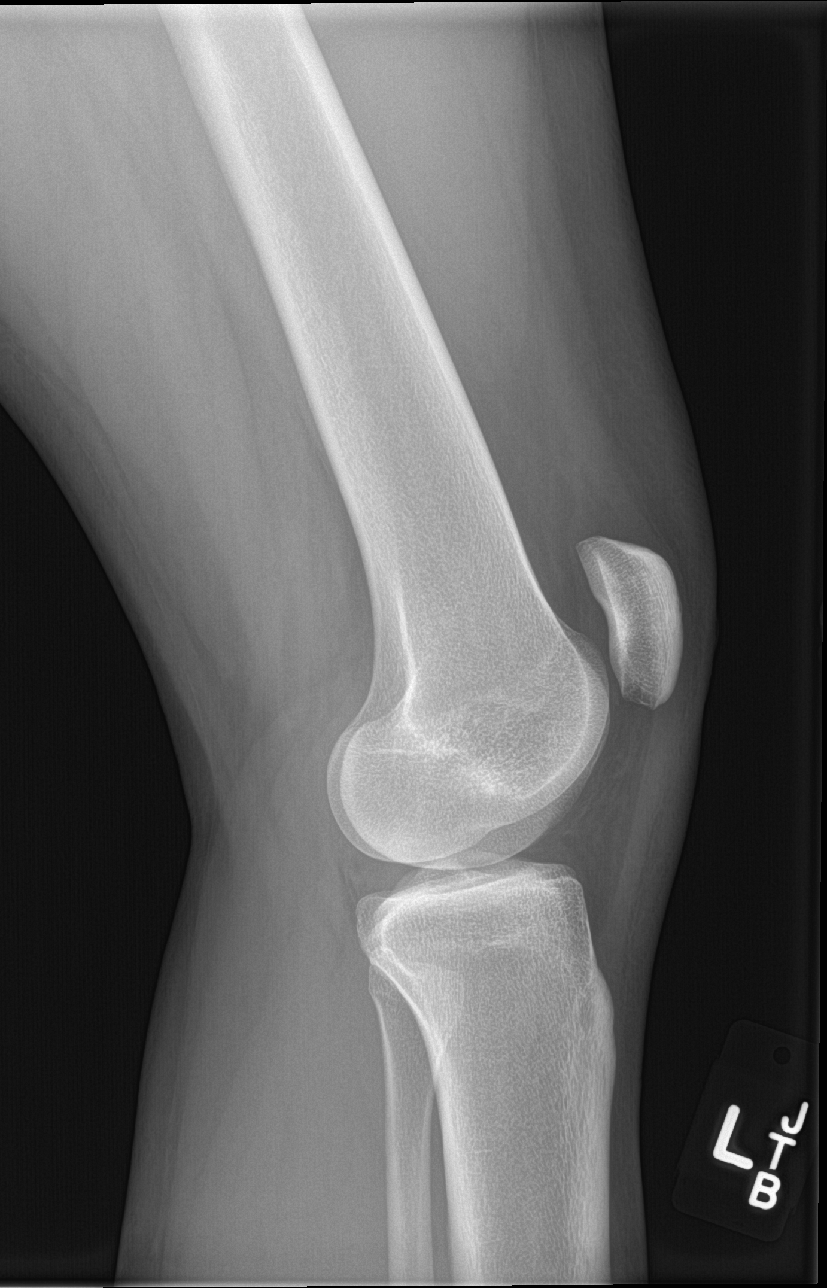

[knee obl (1 of 2)]
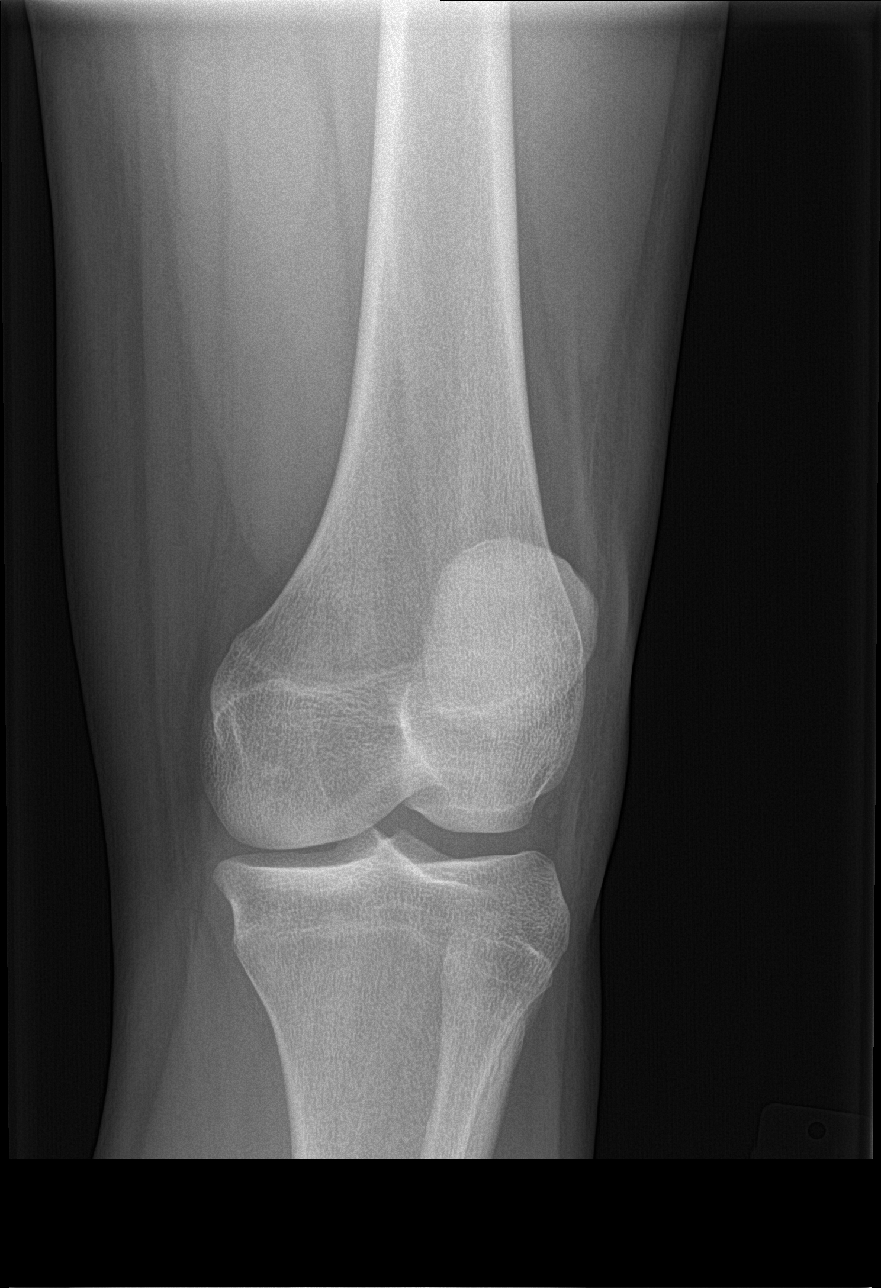

[knee obl (2 of 2)]
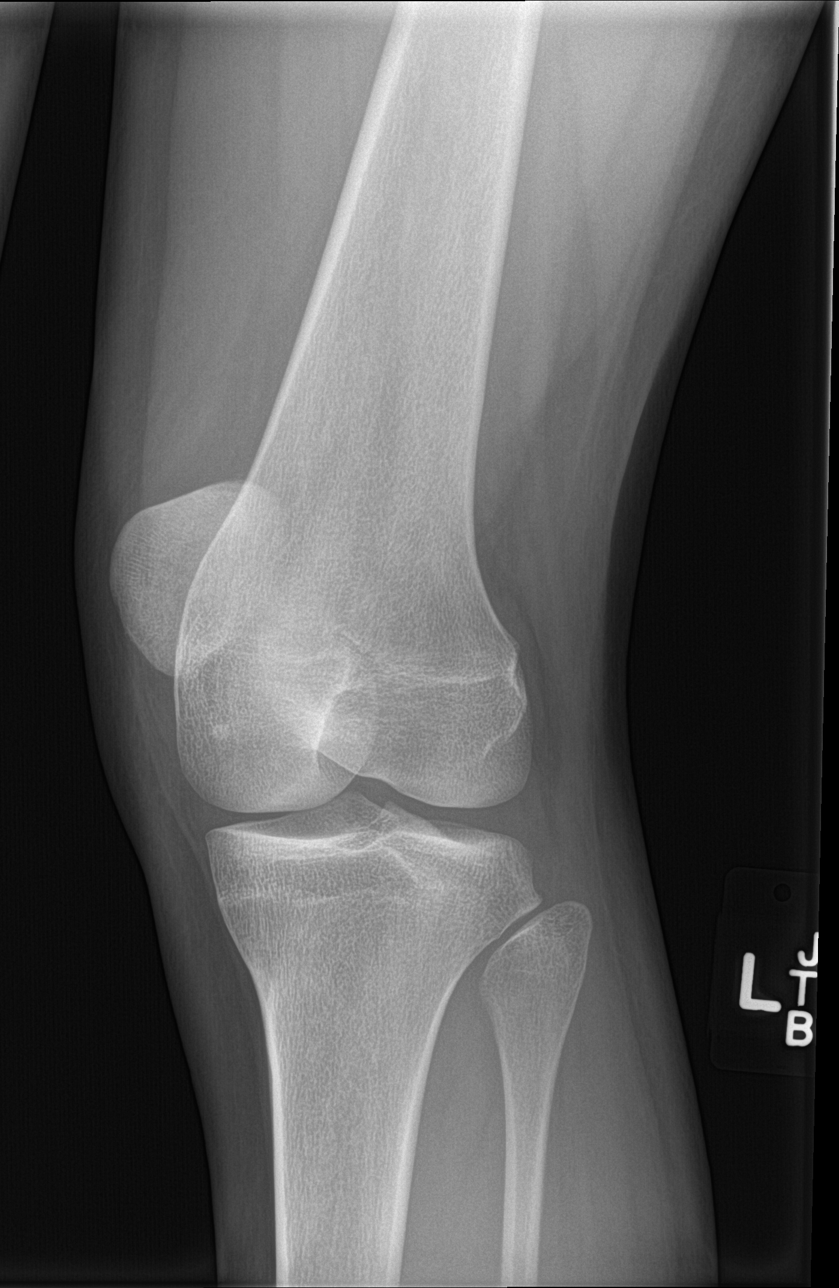

[4 of 4 positions shown; findings below may reference images not displayed]

FINDINGS: No joint effusion or fracture.  No degenerative changes.
IMPRESSION: Negative.

## 2016-06-28 ENCOUNTER — Other Ambulatory Visit: Payer: Self-pay | Admitting: Obstetrics and Gynecology

## 2016-06-28 ENCOUNTER — Other Ambulatory Visit (HOSPITAL_COMMUNITY)
Admission: RE | Admit: 2016-06-28 | Discharge: 2016-06-28 | Disposition: A | Discharge status: Home / Self Care | Payer: Managed Care, Other (non HMO) | Source: Ambulatory Visit | Attending: Obstetrics and Gynecology | Admitting: Obstetrics and Gynecology

## 2016-06-28 DIAGNOSIS — Z01419 Encounter for gynecological examination (general) (routine) without abnormal findings: Secondary | ICD-10-CM | POA: Diagnosis present

## 2016-06-29 LAB — CYTOLOGY - PAP

## 2021-08-09 ENCOUNTER — Other Ambulatory Visit: Payer: Self-pay

## 2021-08-09 ENCOUNTER — Ambulatory Visit (HOSPITAL_COMMUNITY)
Admission: EM | Admit: 2021-08-09 | Discharge: 2021-08-09 | Disposition: A | Discharge status: Home / Self Care | Payer: Managed Care, Other (non HMO) | Attending: Internal Medicine | Admitting: Internal Medicine

## 2021-08-09 ENCOUNTER — Encounter (HOSPITAL_COMMUNITY): Payer: Self-pay

## 2021-08-09 DIAGNOSIS — Z711 Person with feared health complaint in whom no diagnosis is made: Secondary | ICD-10-CM

## 2021-08-09 DIAGNOSIS — N898 Other specified noninflammatory disorders of vagina: Secondary | ICD-10-CM

## 2021-08-09 LAB — HIV ANTIBODY (ROUTINE TESTING W REFLEX): HIV Screen 4th Generation wRfx: NONREACTIVE

## 2021-08-09 NOTE — Discharge Instructions (Addendum)
We will contact you tomorrow if you need to be treated for anything based off of your vaginal swab or your blood testing for HIV and syphilis.  If you have significant worsening of symptoms, especially if you experience fever or difficulty to tolerate anything by mouth, you should be seen by medical provider right away.

## 2021-08-09 NOTE — ED Triage Notes (Addendum)
Pt in with c/o vaginal discharge that she noticed 1 time and a odor. States discharge was green  Requesting std testing

## 2021-08-09 NOTE — ED Provider Notes (Signed)
[facility]    CSN: [id number] Arrival date & time: 08/09/21  1637      History   Chief Complaint Chief Complaint  Patient presents with   Vaginal Discharge    HPI Gina Salas is a 25 y.o. female.   Vaginal Odor Odor started 1 month ago Discharge appears normal She denies vaginal pruritis, abnormal vaginal bleeding, dysuria, hematuria, pelvic pain, nausea, vomiting, fevers Denies history of STIs She is sexually active and does use condoms.   Contraception: condoms.   Patient's last menstrual period was 07/18/2021 (exact date).  She does not douche. Desires HIV/RPR: yes     Past Medical History:  Diagnosis Date   Elbow injury     Patient Active Problem List   Diagnosis Date Noted   Elbow injury     Past Surgical History:  Procedure Laterality Date   WISDOM TOOTH EXTRACTION Bilateral     OB History   No obstetric history on file.      Home Medications    Prior to Admission medications   Medication Sig Start Date End Date Taking? Authorizing Provider  amoxicillin (AMOXIL) 500 MG capsule Take 2 capsules (1,000 mg total) by mouth 2 (two) times daily. 05/20/14   Hayden Rasmussen, NP  cetirizine (ZYRTEC) 10 MG tablet Take 10 mg by mouth daily as needed for allergies.    [provider]  ibuprofen (ADVIL,MOTRIN) 200 MG tablet Take 400 mg by mouth every 6 (six) hours as needed for cramping.    [provider]  naproxen (NAPROSYN) 500 MG tablet Take 1 tablet (500 mg total) by mouth 2 (two) times daily. 06/02/15   Hess, Nada Boozer, PA-C    Family History History reviewed. No pertinent family history.  Social History Social History   Tobacco Use   Smoking status: Never   Smokeless tobacco: Never  Substance Use Topics   Alcohol use: No   Drug use: No     Allergies   Patient has no known allergies.   Review of Systems Review of Systems  All other systems reviewed and are negative. Per HPI  Physical Exam Triage Vital  Signs ED Triage Vitals  Enc Vitals Group     BP 08/09/21 1742 112/67     Pulse Rate 08/09/21 1742 77     Resp 08/09/21 1742 17     Temp 08/09/21 1742 98.1 F (36.7 C)     Temp Source 08/09/21 1742 Oral     SpO2 08/09/21 1742 99 %     Weight --      Height --      Head Circumference --      Peak Flow --      Pain Score 08/09/21 1741 0     Pain Loc --      Pain Edu? --      Excl. in GC? --    No data found.  Updated Vital Signs BP 112/67 (BP Location: Right Arm)   Pulse 77   Temp 98.1 F (36.7 C) (Oral)   Resp 17   LMP 07/18/2021 (Exact Date)   SpO2 99%   Visual Acuity Right Eye Distance:   Left Eye Distance:   Bilateral Distance:    Right Eye Near:   Left Eye Near:    Bilateral Near:     Physical Exam Constitutional:      General: She is not in acute distress.    Appearance: Normal appearance. She is not ill-appearing or toxic-appearing.  Cardiovascular:     Rate and Rhythm: Normal rate.  Pulmonary:     Effort: Pulmonary effort is normal.  Abdominal:     Palpations: Abdomen is soft.     Tenderness: There is no abdominal tenderness. There is no guarding.  Skin:    General: Skin is warm and dry.  Neurological:     Mental Status: She is alert.  Psychiatric:        Mood and Affect: Mood normal.        Behavior: Behavior normal.        Thought Content: Thought content normal.        Judgment: Judgment normal.     UC Treatments / Results  Labs (all labs ordered are listed, but only abnormal results are displayed) Labs Reviewed  HIV ANTIBODY (ROUTINE TESTING W REFLEX)  RPR  CERVICOVAGINAL ANCILLARY ONLY    EKG   Radiology No results found.  Procedures Procedures (including critical care time)  Medications Ordered in UC Medications - No data to display  Initial Impression / Assessment and Plan / UC Course  I have reviewed the triage vital signs and the nursing notes.  Pertinent labs & imaging results that were available during my care of  the patient were reviewed by me and considered in my medical decision making (see chart for details).     Patient is a 25 year old previously healthy female who presents with vaginal odor.  LMP is appropriate and she denies any chance of pregnancy.  She would like to be tested for sexually transmitted diseases.  Ordered self swab for vaginal odor and will treat based off of these results.  We will also obtain an HIV and RPR at patient's request.  Advised that if she has significant worsening of symptoms, especially if she develops fever or difficulty to tolerate p.o., would recommend evaluation by medical provider.  She was discharged home in stable condition.   Final Clinical Impressions(s) / UC Diagnoses   Final diagnoses:  Vaginal odor  Concern about STD in female without diagnosis     Discharge Instructions      We will contact you tomorrow if you need to be treated for anything based off of your vaginal swab or your blood testing for HIV and syphilis.  If you have significant worsening of symptoms, especially if you experience fever or difficulty to tolerate anything by mouth, you should be seen by medical provider right away.     ED Prescriptions   None    PDMP not reviewed this encounter.   Unknown Jim, DO 08/09/21 1810

## 2021-08-10 LAB — RPR: RPR Ser Ql: NONREACTIVE

## 2021-08-11 LAB — CERVICOVAGINAL ANCILLARY ONLY
Bacterial Vaginitis (gardnerella): POSITIVE — AB
Candida Glabrata: NEGATIVE
Candida Vaginitis: POSITIVE — AB
Chlamydia: POSITIVE — AB
Comment: NEGATIVE
Comment: NEGATIVE
Comment: NEGATIVE
Comment: NEGATIVE
Comment: NEGATIVE
Comment: NORMAL
Neisseria Gonorrhea: NEGATIVE
Trichomonas: NEGATIVE

## 2021-08-16 ENCOUNTER — Telehealth: Payer: Self-pay

## 2021-08-16 NOTE — Telephone Encounter (Signed)
Patient called stating she went to the urgent care for STD testing and was told to give [place] a call to get her results. I returned pt call and advised her that she has not been seen with [place] since 2016 and she is considered a new pt I offered pt an appointment she declined she stated she is unsure if she has insurance at the moment. She was advised to give the urgent care a call to get her results. YL,RMA

## 2021-08-17 ENCOUNTER — Telehealth (HOSPITAL_COMMUNITY): Payer: Self-pay | Admitting: Emergency Medicine

## 2021-08-17 MED ORDER — FLUCONAZOLE 150 MG PO TABS: 150.0000 mg | ORAL_TABLET | Freq: Once | ORAL | 0 refills | Status: AC

## 2021-08-17 MED ORDER — DOXYCYCLINE HYCLATE 100 MG PO CAPS: 100.0000 mg | ORAL_CAPSULE | Freq: Two times a day (BID) | ORAL | 0 refills | Status: AC

## 2021-08-17 MED ORDER — METRONIDAZOLE 500 MG PO TABS: 500.0000 mg | ORAL_TABLET | Freq: Two times a day (BID) | ORAL | 0 refills | Status: DC

## 2021-10-03 ENCOUNTER — Encounter (HOSPITAL_COMMUNITY): Payer: Self-pay

## 2021-10-03 ENCOUNTER — Ambulatory Visit (HOSPITAL_COMMUNITY)
Admission: EM | Admit: 2021-10-03 | Discharge: 2021-10-03 | Disposition: A | Discharge status: Home / Self Care | Payer: Self-pay | Attending: Emergency Medicine | Admitting: Emergency Medicine

## 2021-10-03 ENCOUNTER — Other Ambulatory Visit: Payer: Self-pay

## 2021-10-03 DIAGNOSIS — N898 Other specified noninflammatory disorders of vagina: Secondary | ICD-10-CM | POA: Insufficient documentation

## 2021-10-03 DIAGNOSIS — U071 COVID-19: Secondary | ICD-10-CM | POA: Insufficient documentation

## 2021-10-03 MED ORDER — BENZONATATE 100 MG PO CAPS: 100.0000 mg | ORAL_CAPSULE | Freq: Two times a day (BID) | ORAL | 0 refills | Status: DC | PRN

## 2021-10-03 MED ORDER — ONDANSETRON 4 MG PO TBDP: 4.0000 mg | ORAL_TABLET | Freq: Three times a day (TID) | ORAL | 0 refills | Status: AC | PRN

## 2021-10-03 MED ORDER — MOLNUPIRAVIR EUA 200MG CAPSULE: 4.0000 | ORAL_CAPSULE | Freq: Two times a day (BID) | ORAL | 0 refills | Status: AC

## 2021-10-03 MED ORDER — ACETAMINOPHEN 325 MG PO TABS
975.0000 mg | ORAL_TABLET | Freq: Once | ORAL | Status: AC
  Administered 2021-10-03: 975 mg via ORAL

## 2021-10-03 MED ORDER — ACETAMINOPHEN 325 MG PO TABS
ORAL_TABLET | ORAL | Status: AC
  Filled 2021-10-03: qty 3

## 2021-10-03 NOTE — Discharge Instructions (Addendum)
  You have tested positive for COVID-19, meaning that you were infected with the novel coronavirus and could give the virus to others.  It is vitally important that you stay home so you do not spread it to others.      Please continue isolation at home, for at least 10 days since the start of your symptoms and until you have had 24 hours with no fever (without taking a fever reducer) and with improving of symptoms.  If you have no symptoms but tested positive (or all symptoms resolve after 5 days and you have no fever) you can leave your house but continue to wear a mask around others for an additional 5 days. If you have a fever,continue to stay home until you have had 24 hours of no fever. Most cases improve 5-10 days from onset but we have seen a small number of patients who have gotten worse after the 10 days.  Please be sure to watch for worsening symptoms and remain taking the proper precautions.   Go to the nearest hospital ED for assessment if fever/cough/breathlessness are severe or illness seems like a threat to life.    The following symptoms may appear 2-14 days after exposure: Fever Cough Shortness of breath or difficulty breathing Chills Repeated shaking with chills Muscle pain Headache Sore throat New loss of taste or smell Fatigue Congestion or runny nose Nausea or vomiting Diarrhea   You may also take acetaminophen (Tylenol) as needed for fever.  HOME CARE: Only take medications as instructed by your medical team. Drink plenty of fluids and get plenty of rest. A steam or ultrasonic humidifier can help if you have congestion.   GET HELP RIGHT AWAY IF YOU HAVE EMERGENCY WARNING SIGNS.  Call 911 or proceed to your closest emergency facility if: You develop worsening high fever. Trouble breathing Bluish lips or face Persistent pain or pressure in the chest New confusion Inability to wake or stay awake You cough up blood. Your symptoms become more severe Inability  to hold down food or fluids  This list is not all possible symptoms. Contact your medical provider for any symptoms that are severe or concerning to you.      

## 2021-10-03 NOTE — ED Provider Notes (Signed)
[facility]    CSN: [id number] Arrival date & time: 10/03/21  2330      History   Chief Complaint Chief Complaint  Patient presents with   Covid +    HPI Gina Salas is a 25 y.o. female.   Patient presents to the urgent care with chief complaint of COVID 19.  She states that she began having body aches and cough last night.  States that she had a positive home test this morning.  She reports associated fever, nausea, vomiting, and cough.  She has not had any relief of her symptoms with over-the-counter Tylenol.  She has not taken anything additional for her symptoms.  Additionally, patient complains of white vaginal discharge which has been going on for "some time."  She states that she has had new sexual contacts.  She would like testing today.  Denies having pelvic pain or dysuria.  The history is provided by the patient. No language interpreter was used.   Past Medical History:  Diagnosis Date   Elbow injury     Patient Active Problem List   Diagnosis Date Noted   Elbow injury     Past Surgical History:  Procedure Laterality Date   WISDOM TOOTH EXTRACTION Bilateral     OB History   No obstetric history on file.      Home Medications    Prior to Admission medications   Medication Sig Start Date End Date Taking? Authorizing Provider  benzonatate (TESSALON) 100 MG capsule Take 1 capsule (100 mg total) by mouth 2 (two) times daily as needed for cough. 10/03/21  Yes Roxy Horseman, PA-C  molnupiravir EUA (LAGEVRIO) 200 mg CAPS capsule Take 4 capsules (800 mg total) by mouth 2 (two) times daily for 5 days. 10/03/21 10/08/21 Yes Roxy Horseman, PA-C  ondansetron (ZOFRAN ODT) 4 MG disintegrating tablet Take 1 tablet (4 mg total) by mouth every 8 (eight) hours as needed for nausea or vomiting. 10/03/21  Yes Roxy Horseman, PA-C  cetirizine (ZYRTEC) 10 MG tablet Take 10 mg by mouth daily as needed for allergies.    [provider]  ibuprofen  (ADVIL,MOTRIN) 200 MG tablet Take 400 mg by mouth every 6 (six) hours as needed for cramping.    [provider]  metroNIDAZOLE (FLAGYL) 500 MG tablet Take 1 tablet (500 mg total) by mouth 2 (two) times daily. 08/17/21   Lamptey, Britta Mccreedy, MD  naproxen (NAPROSYN) 500 MG tablet Take 1 tablet (500 mg total) by mouth 2 (two) times daily. 06/02/15   Hess, Nada Boozer, PA-C    Family History History reviewed. No pertinent family history.  Social History Social History   Tobacco Use   Smoking status: Never   Smokeless tobacco: Never  Substance Use Topics   Alcohol use: No   Drug use: No     Allergies   Patient has no known allergies.   Review of Systems Review of Systems  All other systems reviewed and are negative.   Physical Exam Triage Vital Signs ED Triage Vitals  Enc Vitals Group     BP 10/03/21 0919 125/85     Pulse Rate 10/03/21 0918 (!) 111     Resp 10/03/21 0918 18     Temp 10/03/21 0918 (!) 101.8 F (38.8 C)     Temp Source 10/03/21 0918 Oral     SpO2 10/03/21 0918 98 %     Weight --      Height --  Head Circumference --      Peak Flow --      Pain Score 10/03/21 0916 7     Pain Loc --      Pain Edu? --      Excl. in GC? --    No data found.  Updated Vital Signs BP 125/85   Pulse (!) 111   Temp (!) 101.8 F (38.8 C) (Oral)   Resp 18   LMP 09/23/2021   SpO2 98%   Visual Acuity Right Eye Distance:   Left Eye Distance:   Bilateral Distance:    Right Eye Near:   Left Eye Near:    Bilateral Near:     Physical Exam Vitals and nursing note reviewed.  Constitutional:      General: She is not in acute distress.    Appearance: She is well-developed.  HENT:     Head: Normocephalic and atraumatic.  Eyes:     Conjunctiva/sclera: Conjunctivae normal.  Cardiovascular:     Rate and Rhythm: Regular rhythm. Tachycardia present.     Heart sounds: No murmur heard. Pulmonary:     Effort: Pulmonary effort is normal. No respiratory distress.   Abdominal:     General: There is no distension.  Musculoskeletal:     Cervical back: Neck supple.  Skin:    General: Skin is warm and dry.  Neurological:     Mental Status: She is alert and oriented to person, place, and time.  Psychiatric:        Mood and Affect: Mood normal.        Behavior: Behavior normal.     UC Treatments / Results  Labs (all labs ordered are listed, but only abnormal results are displayed) Labs Reviewed  CERVICOVAGINAL ANCILLARY ONLY    EKG   Radiology No results found.  Procedures Procedures (including critical care time)  Medications Ordered in UC Medications  acetaminophen (TYLENOL) tablet 975 mg (975 mg Oral Given 10/03/21 0921)    Initial Impression / Assessment and Plan / UC Course  I have reviewed the triage vital signs and the nursing notes.  Pertinent labs & imaging results that were available during my care of the patient were reviewed by me and considered in my medical decision making (see chart for details).     Patient here with positive home COVID test.  She is noted to be febrile to 101.8 and mildly tachycardic.  Likely secondary to fever.  Will give dose of Tylenol here.  Patient would like to try antiviral therapy.   We have also had patient self swab for her vaginal discharge.  She states that this is been going on for quite some time.  Doubt emergent etiology.  Overall, patient is sitting up on the exam table and does not appear toxic.  I feel that she is appropriate for outpatient treatment and follow-up.  No further work-up at this time. Final Clinical Impressions(s) / UC Diagnoses   Final diagnoses:  COVID-19  Vaginal discharge     Discharge Instructions          You have tested positive for COVID-19, meaning that you were infected with the novel coronavirus and could give the virus to others.  It is vitally important that you stay home so you do not spread it to others.      Please continue isolation at  home, for at least 10 days since the start of your symptoms and until you have had 24 hours with no fever (  without taking a fever reducer) and with improving of symptoms.  If you have no symptoms but tested positive (or all symptoms resolve after 5 days and you have no fever) you can leave your house but continue to wear a mask around others for an additional 5 days. If you have a fever,continue to stay home until you have had 24 hours of no fever. Most cases improve 5-10 days from onset but we have seen a small number of patients who have gotten worse after the 10 days.  Please be sure to watch for worsening symptoms and remain taking the proper precautions.   Go to the nearest hospital ED for assessment if fever/cough/breathlessness are severe or illness seems like a threat to life.    The following symptoms may appear 2-14 days after exposure: Fever Cough Shortness of breath or difficulty breathing Chills Repeated shaking with chills Muscle pain Headache Sore throat New loss of taste or smell Fatigue Congestion or runny nose Nausea or vomiting Diarrhea   You may also take acetaminophen (Tylenol) as needed for fever.  HOME CARE: Only take medications as instructed by your medical team. Drink plenty of fluids and get plenty of rest. A steam or ultrasonic humidifier can help if you have congestion.   GET HELP RIGHT AWAY IF YOU HAVE EMERGENCY WARNING SIGNS.  Call 911 or proceed to your closest emergency facility if: You develop worsening high fever. Trouble breathing Bluish lips or face Persistent pain or pressure in the chest New confusion Inability to wake or stay awake You cough up blood. Your symptoms become more severe Inability to hold down food or fluids  This list is not all possible symptoms. Contact your medical provider for any symptoms that are severe or concerning to you.       ED Prescriptions     Medication Sig Dispense Auth. Provider   molnupiravir EUA  (LAGEVRIO) 200 mg CAPS capsule Take 4 capsules (800 mg total) by mouth 2 (two) times daily for 5 days. 40 capsule Roxy Horseman, PA-C   benzonatate (TESSALON) 100 MG capsule Take 1 capsule (100 mg total) by mouth 2 (two) times daily as needed for cough. 20 capsule Roxy Horseman, PA-C   ondansetron (ZOFRAN ODT) 4 MG disintegrating tablet Take 1 tablet (4 mg total) by mouth every 8 (eight) hours as needed for nausea or vomiting. 10 tablet Roxy Horseman, PA-C      PDMP not reviewed this encounter.   Roxy Horseman, PA-C 10/03/21 [phone]

## 2021-10-03 NOTE — ED Triage Notes (Signed)
Pt presents with fever, cough, generalized body aches, headache, and fatigue since yesterday; pt tested positive for covid this morning.   Pt also requested a vaginal swab.

## 2021-10-04 LAB — CERVICOVAGINAL ANCILLARY ONLY
Bacterial Vaginitis (gardnerella): NEGATIVE
Candida Glabrata: NEGATIVE
Candida Vaginitis: NEGATIVE
Chlamydia: NEGATIVE
Comment: NEGATIVE
Comment: NEGATIVE
Comment: NEGATIVE
Comment: NEGATIVE
Comment: NEGATIVE
Comment: NORMAL
Neisseria Gonorrhea: NEGATIVE
Trichomonas: NEGATIVE

## 2022-09-20 ENCOUNTER — Ambulatory Visit (HOSPITAL_COMMUNITY)
Admission: EM | Admit: 2022-09-20 | Discharge: 2022-09-20 | Disposition: A | Discharge status: Home / Self Care | Payer: Commercial Managed Care - HMO | Attending: Emergency Medicine | Admitting: Emergency Medicine

## 2022-09-20 ENCOUNTER — Encounter (HOSPITAL_COMMUNITY): Payer: Self-pay | Admitting: Emergency Medicine

## 2022-09-20 ENCOUNTER — Other Ambulatory Visit: Payer: Self-pay

## 2022-09-20 DIAGNOSIS — Z1152 Encounter for screening for COVID-19: Secondary | ICD-10-CM | POA: Insufficient documentation

## 2022-09-20 DIAGNOSIS — Z1159 Encounter for screening for other viral diseases: Secondary | ICD-10-CM | POA: Diagnosis present

## 2022-09-20 DIAGNOSIS — J329 Chronic sinusitis, unspecified: Secondary | ICD-10-CM | POA: Diagnosis not present

## 2022-09-20 LAB — POCT RAPID STREP A, ED / UC: Streptococcus, Group A Screen (Direct): NEGATIVE

## 2022-09-20 LAB — RESP PANEL BY RT-PCR (FLU A&B, COVID) ARPGX2
Influenza A by PCR: NEGATIVE
Influenza B by PCR: NEGATIVE
SARS Coronavirus 2 by RT PCR: NEGATIVE

## 2022-09-20 NOTE — ED Provider Notes (Signed)
[facility]    CSN: [id number] Arrival date & time: 09/20/22  1343    HISTORY   Chief Complaint  Patient presents with   URI   HPI Gina Salas is a pleasant, 26 y.o. female who presents to urgent care today. Patient complains of mild sinus congestion, nonproductive cough for the last 12 hours.  Patient states her employer is requiring her to be tested for strep pharyngitis, COVID-19 and influenza before she can return to work.  Patient is requesting testing today.  Patient has normal vital signs on arrival and reports otherwise feeling well today.  Patient denies known sick contacts.  Patient denies of sore throat, loss of taste or smell, otalgia, nausea, vomiting, diarrhea, body aches, fever, chills.  The history is provided by the patient.   Past Medical History:  Diagnosis Date   Elbow injury    Patient Active Problem List   Diagnosis Date Noted   Elbow injury    Past Surgical History:  Procedure Laterality Date   WISDOM TOOTH EXTRACTION Bilateral    OB History   No obstetric history on file.    Home Medications    Prior to Admission medications   Medication Sig Start Date End Date Taking? Authorizing Provider  ibuprofen (ADVIL,MOTRIN) 200 MG tablet Take 400 mg by mouth every 6 (six) hours as needed for cramping.    [provider]  naproxen (NAPROSYN) 500 MG tablet Take 1 tablet (500 mg total) by mouth 2 (two) times daily. 06/02/15   Hess, Hessie Diener, PA-C  ondansetron (ZOFRAN ODT) 4 MG disintegrating tablet Take 1 tablet (4 mg total) by mouth every 8 (eight) hours as needed for nausea or vomiting. 10/03/21   Montine Circle, PA-C    Family History History reviewed. No pertinent family history. Social History Social History   Tobacco Use   Smoking status: Never   Smokeless tobacco: Never  Vaping Use   Vaping Use: Never used  Substance Use Topics   Alcohol use: Yes   Drug use: No   Allergies   Patient has no known  allergies.  Review of Systems Review of Systems Pertinent findings revealed after performing a 14 point review of systems has been noted in the history of present illness.  Physical Exam Triage Vital Signs ED Triage Vitals  Enc Vitals Group     BP 09/30/21 0827 (!) 147/82     Pulse Rate 09/30/21 0827 72     Resp 09/30/21 0827 18     Temp 09/30/21 0827 98.3 F (36.8 C)     Temp Source 09/30/21 0827 Oral     SpO2 09/30/21 0827 98 %     Weight --      Height --      Head Circumference --      Peak Flow --      Pain Score 09/30/21 0826 5     Pain Loc --      Pain Edu? --      Excl. in [place]? --   No data found.  Updated Vital Signs BP 120/74 (BP Location: Left Arm)   Pulse 90   Temp 98.7 F (37.1 C) (Oral)   Resp 18   LMP 09/15/2022   SpO2 97%   Physical Exam Vitals and nursing note reviewed.  Constitutional:      General: She is not in acute distress.    Appearance: Normal appearance. She is not ill-appearing.  HENT:     Head: Normocephalic and  atraumatic.     Salivary Glands: Right salivary gland is not diffusely enlarged or tender. Left salivary gland is not diffusely enlarged or tender.     Right Ear: Tympanic membrane, ear canal and external ear normal. No drainage. No middle ear effusion. There is no impacted cerumen. Tympanic membrane is not erythematous or bulging.     Left Ear: Tympanic membrane, ear canal and external ear normal. No drainage.  No middle ear effusion. There is no impacted cerumen. Tympanic membrane is not erythematous or bulging.     Nose: Nose normal. No nasal deformity, septal deviation, mucosal edema, congestion or rhinorrhea.     Right Turbinates: Not enlarged, swollen or pale.     Left Turbinates: Not enlarged, swollen or pale.     Right Sinus: No maxillary sinus tenderness or frontal sinus tenderness.     Left Sinus: No maxillary sinus tenderness or frontal sinus tenderness.     Mouth/Throat:     Lips: Pink. No lesions.     Mouth: Mucous  membranes are moist. No oral lesions.     Pharynx: Oropharynx is clear. Uvula midline. No posterior oropharyngeal erythema or uvula swelling.     Tonsils: No tonsillar exudate. 0 on the right. 0 on the left.  Eyes:     General: Lids are normal.        Right eye: No discharge.        Left eye: No discharge.     Extraocular Movements: Extraocular movements intact.     Conjunctiva/sclera: Conjunctivae normal.     Right eye: Right conjunctiva is not injected.     Left eye: Left conjunctiva is not injected.  Neck:     Trachea: Trachea and phonation normal.  Cardiovascular:     Rate and Rhythm: Normal rate and regular rhythm.     Pulses: Normal pulses.     Heart sounds: Normal heart sounds. No murmur heard.    No friction rub. No gallop.  Pulmonary:     Effort: Pulmonary effort is normal. No accessory muscle usage, prolonged expiration or respiratory distress.     Breath sounds: Normal breath sounds. No stridor, decreased air movement or transmitted upper airway sounds. No decreased breath sounds, wheezing, rhonchi or rales.  Chest:     Chest wall: No tenderness.  Musculoskeletal:        General: Normal range of motion.     Cervical back: Normal range of motion and neck supple. Normal range of motion.  Lymphadenopathy:     Cervical: No cervical adenopathy.  Skin:    General: Skin is warm and dry.     Findings: No erythema or rash.  Neurological:     General: No focal deficit present.     Mental Status: She is alert and oriented to person, place, and time.  Psychiatric:        Mood and Affect: Mood normal.        Behavior: Behavior normal.     Visual Acuity Right Eye Distance:   Left Eye Distance:   Bilateral Distance:    Right Eye Near:   Left Eye Near:    Bilateral Near:     UC Couse / Diagnostics / Procedures:     Radiology No results found.  Procedures Procedures (including critical care time) EKG  Pending results:  Labs Reviewed  RESP PANEL BY RT-PCR (FLU  A&B, COVID) [id number]  POCT RAPID STREP A, ED / UC    Medications Ordered in UC: Medications -  No data to display  UC Diagnoses / Final Clinical Impressions(s)   I have reviewed the triage vital signs and the nursing notes.  Pertinent labs & imaging results that were available during my care of the patient were reviewed by me and considered in my medical decision making (see chart for details).    Final diagnoses:  Screening for viral disease  Rhinosinusitis   Rapid strep test today is negative.  Based on patient not complaining of sore throat, currently having a cough as well as my physical exam findings, throat culture not indicated as strep pharyngitis unlikely.  Patient provided with a note to return to work stating that as long as her COVID-19 and flu tests are negative she can return.  Results for COVID-19 and influenza tests are pending, patient advised she can print these from her MyChart once they are complete.  Return precautions advised.  ED Prescriptions   None    PDMP not reviewed this encounter.  Disposition Upon Discharge:  Condition: stable for discharge home Home: take medications as prescribed; routine discharge instructions as discussed; follow up as advised.  Patient presented with an acute illness with associated systemic symptoms and significant discomfort requiring urgent management. In my opinion, this is a condition that a prudent lay person (someone who possesses an average knowledge of health and medicine) may potentially expect to result in complications if not addressed urgently such as respiratory distress, impairment of bodily function or dysfunction of bodily organs.   Routine symptom specific, illness specific and/or disease specific instructions were discussed with the patient and/or caregiver at length.   As such, the patient has been evaluated and assessed, work-up was performed and treatment was provided in alignment with urgent care protocols and  evidence based medicine.  Patient/parent/caregiver has been advised that the patient may require follow up for further testing and treatment if the symptoms continue in spite of treatment, as clinically indicated and appropriate.  If the patient was tested for COVID-19, Influenza and/or RSV, then the patient/parent/guardian was advised to isolate at home pending the results of his/her diagnostic coronavirus test and potentially longer if they're positive. I have also advised pt that if his/her COVID-19 test returns positive, it's recommended to self-isolate for at least 10 days after symptoms first appeared AND until fever-free for 24 hours without fever reducer AND other symptoms have improved or resolved. Discussed self-isolation recommendations as well as instructions for household member/close contacts as per the [facility] and [place] [facility], and also gave patient the COVID packet with this information.  Patient/parent/caregiver has been advised to return to the [facility] or PCP in 3-5 days if no better; to PCP or the Emergency Department if new signs and symptoms develop, or if the current signs or symptoms continue to change or worsen for further workup, evaluation and treatment as clinically indicated and appropriate  The patient will follow up with their current PCP if and as advised. If the patient does not currently have a PCP we will assist them in obtaining one.   The patient may need specialty follow up if the symptoms continue, in spite of conservative treatment and management, for further workup, evaluation, consultation and treatment as clinically indicated and appropriate.  Patient/parent/caregiver verbalized understanding and agreement of plan as discussed.  All questions were addressed during visit.  Please see discharge instructions below for further details of plan.  Discharge Instructions:   Discharge Instructions      Your strep test today is negative.  Streptococcal throat  culture will be  performed per our protocol, please keep in mind that the rapid strep test that we perform here at urgent care only catches 40% of strep throat infections.   You received a COVID-19 and influenza PCR today.  The results of your PCR testing will be posted to your MyChart once it is complete.  This typically takes 6 to 12 hours.    If your COVID-19 PCR test is positive, you will be contacted by phone.  Because you do not have a history of being immune compromised, you are currently vaccinated for COVID-19, you are under the age of 66, you do not have a risk of severe disease due to COVID-19, antiviral treatment is not indicated.  You will can return to work on September 22, 2022 as long as you have been fever free for 24 hours without the use of antifever medications such as Tylenol and ibuprofen.     If your influenza PCR test is positive, you will be contacted by phone.  You will be provided with a 5-day course of Tamiflu, the antiviral treatment for influenza.     If both your COVID-19 and influenza PCR tests are negative, then you can safely assume that your illness is due to one of the many less serious illnesses circulating in our community right now.  Conservative care is recommended with rest, drinking plenty of clear fluids, eating only when hungry, taking supportive medications for your symptoms and avoiding being around other people.    Thank you for visiting urgent care today.     This office note has been dictated using Teaching laboratory technician.  Unfortunately, this method of dictation can sometimes lead to typographical or grammatical errors.  I apologize for your inconvenience in advance if this occurs.  Please do not hesitate to reach out to me if clarification is needed.      Theadora Rama Scales, PA-C 09/20/22 1659

## 2022-09-20 NOTE — Discharge Instructions (Signed)
Your strep test today is negative.  Streptococcal throat culture will be performed per our protocol, please keep in mind that the rapid strep test that we perform here at urgent care only catches 40% of strep throat infections.   You received a COVID-19 and influenza PCR today.  The results of your PCR testing will be posted to your MyChart once it is complete.  This typically takes 6 to 12 hours.    If your COVID-19 PCR test is positive, you will be contacted by phone.  Because you do not have a history of being immune compromised, you are currently vaccinated for COVID-19, you are under the age of 65, you do not have a risk of severe disease due to COVID-19, antiviral treatment is not indicated.  You will can return to work on September 22, 2022 as long as you have been fever free for 24 hours without the use of antifever medications such as Tylenol and ibuprofen.     If your influenza PCR test is positive, you will be contacted by phone.  You will be provided with a 5-day course of Tamiflu, the antiviral treatment for influenza.     If both your COVID-19 and influenza PCR tests are negative, then you can safely assume that your illness is due to one of the many less serious illnesses circulating in our community right now.  Conservative care is recommended with rest, drinking plenty of clear fluids, eating only when hungry, taking supportive medications for your symptoms and avoiding being around other people.    Thank you for visiting urgent care today.

## 2022-09-20 NOTE — ED Triage Notes (Signed)
Sinus congestion, cough, and requested testing for covid, strep and flu.  Reports symptoms started yesterday.   Needs the stated testing to go back to work. Denies fever
